# Patient Record
Sex: Male | Born: 1950 | Race: Black or African American | Hispanic: No | Marital: Married | Smoking: Former smoker
Health system: Southern US, Community
[De-identification: ages and names within clinical notes are randomized; demographics above are authoritative.]

## PROBLEM LIST (undated history)

## (undated) DIAGNOSIS — I10 Essential (primary) hypertension: Secondary | ICD-10-CM

---

## 2012-03-30 ENCOUNTER — Ambulatory Visit: Payer: Self-pay | Admitting: Emergency Medicine

## 2012-08-15 ENCOUNTER — Ambulatory Visit: Payer: Self-pay | Admitting: Family Medicine

## 2013-06-17 IMAGING — CR DG CHEST 2V
1 series · 2 of 2 positions shown · non-contrast
Comparison: none

REASON FOR EXAM: dry cough for over a month
COMMENTS:

[Series 1: pa · 0.17mm/px · 2 of 2 slices shown]
[im 1/2]
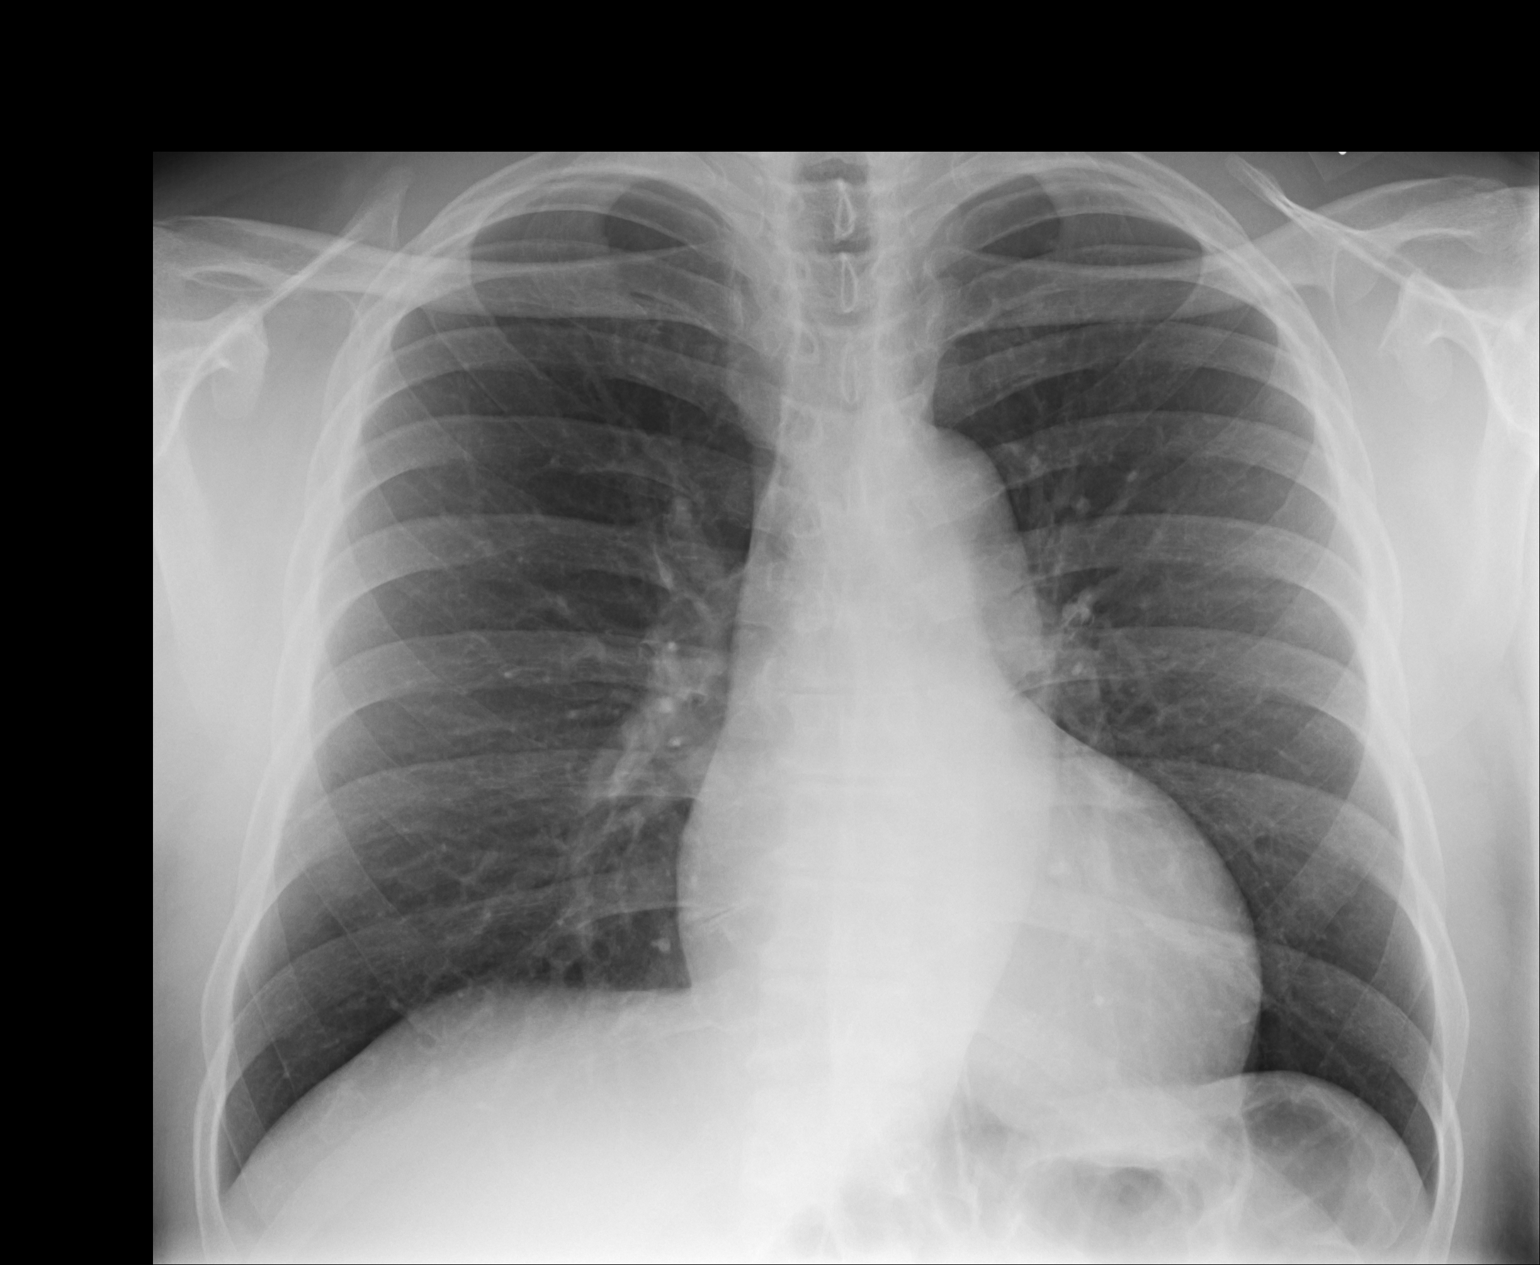
[im 2/2]
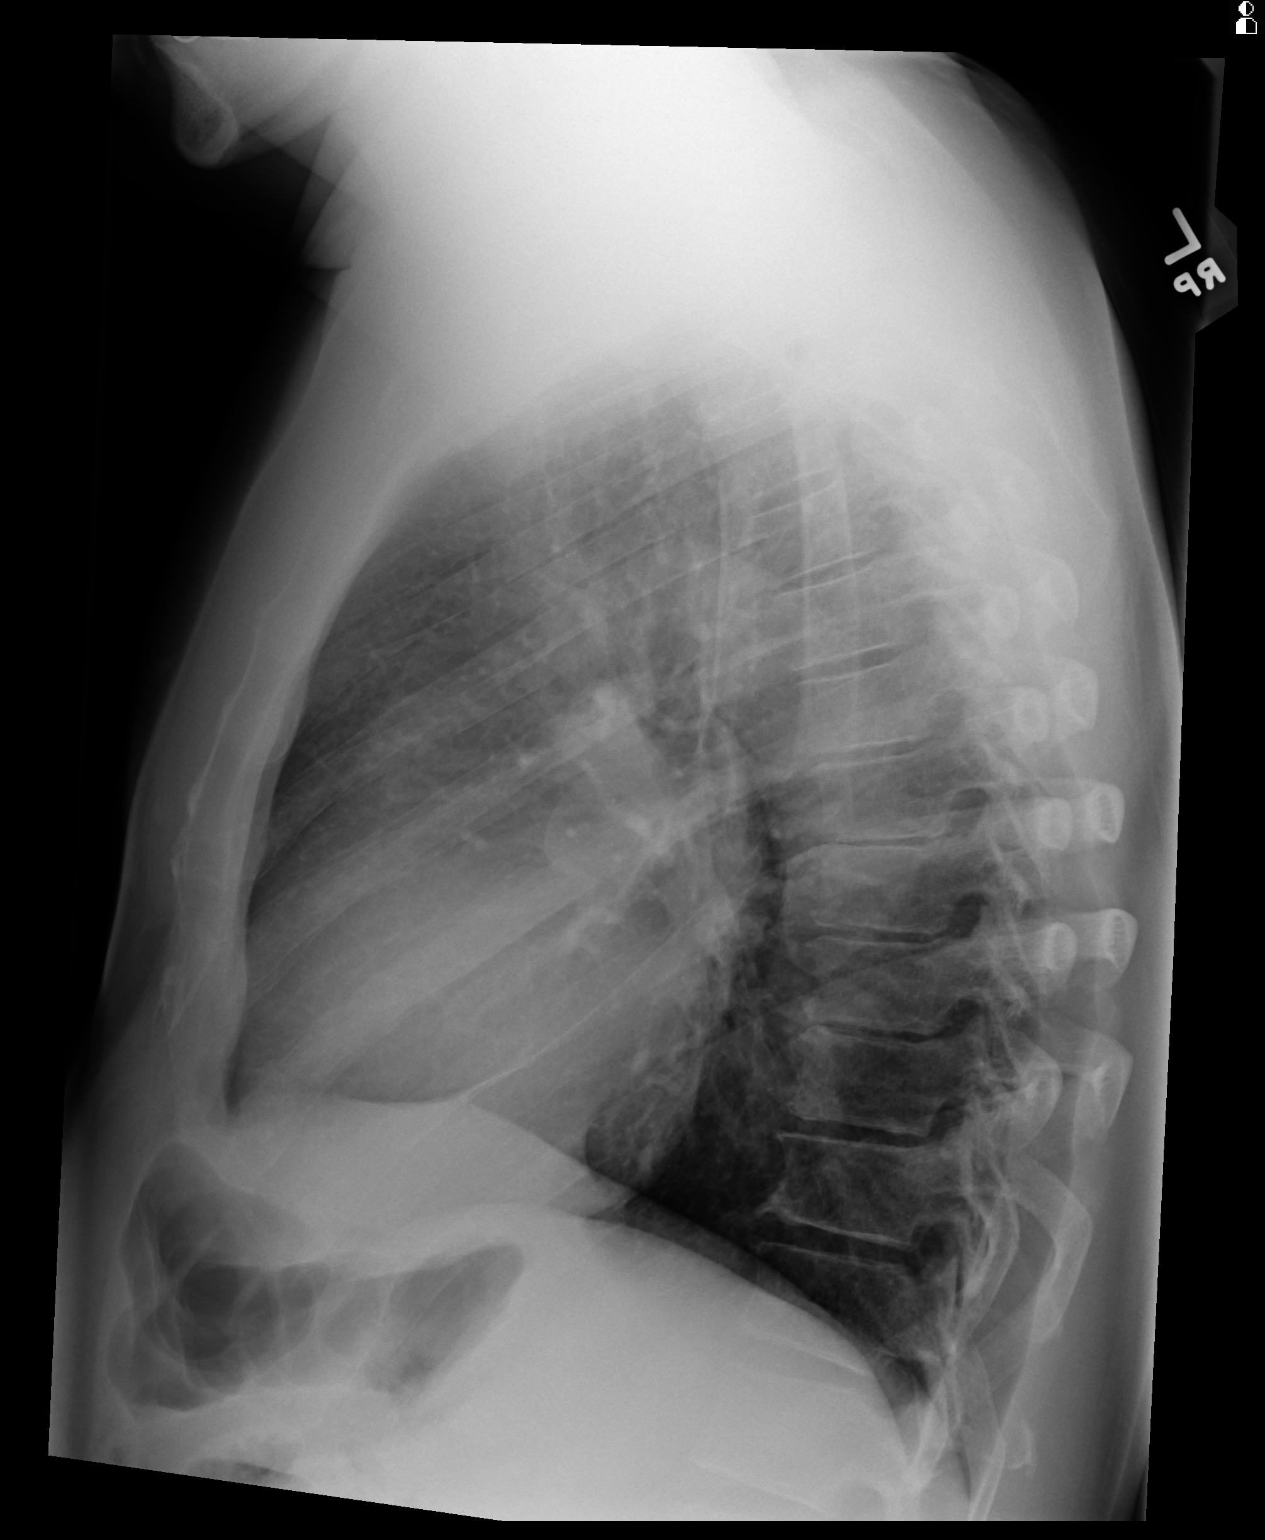

[2 of 2 positions shown; findings below may reference images not displayed]

PROCEDURE:     MDR - MDR CHEST PA(OR AP) AND LATERAL  - March 30, 2012 [DATE]

RESULT:     The lungs are well-expanded and clear. The cardiac silhouette is
normal in size. There is mild tortuosity of the descending thoracic aorta.
The pulmonary vascularity is not engorged. There is no evidence of a pleural
effusion. The mediastinum is normal in width. Mild degenerative disc space
narrowing is noted in the midthoracic spine.
IMPRESSION: I do not see evidence of pneumonia. Very mild
hyperinflation may be voluntary or could reflect underlying reactive airway
disease or COPD.

## 2015-03-30 ENCOUNTER — Ambulatory Visit: Payer: Self-pay

## 2015-03-30 ENCOUNTER — Ambulatory Visit
Admission: EM | Admit: 2015-03-30 | Discharge: 2015-03-30 | Disposition: A | Discharge status: Home / Self Care | Payer: Self-pay | Attending: Family Medicine | Admitting: Family Medicine

## 2015-03-30 ENCOUNTER — Encounter: Payer: Self-pay | Admitting: Emergency Medicine

## 2015-03-30 DIAGNOSIS — J9801 Acute bronchospasm: Secondary | ICD-10-CM | POA: Insufficient documentation

## 2015-03-30 DIAGNOSIS — R0602 Shortness of breath: Secondary | ICD-10-CM

## 2015-03-30 DIAGNOSIS — I110 Hypertensive heart disease with heart failure: Secondary | ICD-10-CM | POA: Insufficient documentation

## 2015-03-30 DIAGNOSIS — I509 Heart failure, unspecified: Secondary | ICD-10-CM | POA: Insufficient documentation

## 2015-03-30 DIAGNOSIS — N19 Unspecified kidney failure: Secondary | ICD-10-CM | POA: Insufficient documentation

## 2015-03-30 DIAGNOSIS — E876 Hypokalemia: Secondary | ICD-10-CM | POA: Insufficient documentation

## 2015-03-30 HISTORY — DX: Essential (primary) hypertension: I10

## 2015-03-30 LAB — CBC WITH DIFFERENTIAL/PLATELET
BASOS PCT: 0 %
Basophils Absolute: 0 10*3/uL (ref 0–0.1)
EOS PCT: 1 %
Eosinophils Absolute: 0 10*3/uL (ref 0–0.7)
HEMATOCRIT: 44 % (ref 40.0–52.0)
HEMOGLOBIN: 14.7 g/dL (ref 13.0–18.0)
Lymphocytes Relative: 12 %
Lymphs Abs: 0.9 10*3/uL — ABNORMAL LOW (ref 1.0–3.6)
MCH: 29.8 pg (ref 26.0–34.0)
MCHC: 33.4 g/dL (ref 32.0–36.0)
MCV: 89.1 fL (ref 80.0–100.0)
Monocytes Absolute: 0.7 10*3/uL (ref 0.2–1.0)
Monocytes Relative: 9 %
NEUTROS ABS: 6.1 10*3/uL (ref 1.4–6.5)
Neutrophils Relative %: 78 %
PLATELETS: 131 10*3/uL — AB (ref 150–440)
RBC: 4.94 MIL/uL (ref 4.40–5.90)
RDW: 17.6 % — AB (ref 11.5–14.5)
WBC: 7.7 10*3/uL (ref 3.8–10.6)

## 2015-03-30 LAB — COMPREHENSIVE METABOLIC PANEL
ALBUMIN: 3.2 g/dL — AB (ref 3.5–5.0)
ALT: 22 U/L (ref 17–63)
AST: 43 U/L — AB (ref 15–41)
Alkaline Phosphatase: 46 U/L (ref 38–126)
Anion gap: 11 (ref 5–15)
BUN: 26 mg/dL — AB (ref 6–20)
CALCIUM: 8.4 mg/dL — AB (ref 8.9–10.3)
CO2: 31 mmol/L (ref 22–32)
Chloride: 92 mmol/L — ABNORMAL LOW (ref 101–111)
Creatinine, Ser: 1.47 mg/dL — ABNORMAL HIGH (ref 0.61–1.24)
GFR calc Af Amer: 58 mL/min — ABNORMAL LOW (ref >60)
GFR calc non Af Amer: 50 mL/min — ABNORMAL LOW (ref >60)
Glucose, Bld: 103 mg/dL — ABNORMAL HIGH (ref 65–99)
Potassium: 2.9 mmol/L — CL (ref 3.5–5.1)
Sodium: 134 mmol/L — ABNORMAL LOW (ref 135–145)
Total Bilirubin: 1 mg/dL (ref 0.3–1.2)
Total Protein: 6.7 g/dL (ref 6.5–8.1)

## 2015-03-30 LAB — BRAIN NATRIURETIC PEPTIDE: B Natriuretic Peptide: 698 pg/mL — ABNORMAL HIGH (ref 0.0–100.0)

## 2015-03-30 MED ORDER — METOLAZONE 2.5 MG PO TABS: 2.5000 mg | ORAL_TABLET | Freq: Every day | ORAL | Status: AC

## 2015-03-30 MED ORDER — IPRATROPIUM-ALBUTEROL 0.5-2.5 (3) MG/3ML IN SOLN
3.0000 mL | Freq: Once | RESPIRATORY_TRACT | Status: AC
  Administered 2015-03-30: 3 mL via RESPIRATORY_TRACT

## 2015-03-30 MED ORDER — LOSARTAN POTASSIUM 25 MG PO TABS: 25.0000 mg | ORAL_TABLET | Freq: Every day | ORAL | Status: AC

## 2015-03-30 MED ORDER — CARVEDILOL 25 MG PO TABS: 25.0000 mg | ORAL_TABLET | Freq: Two times a day (BID) | ORAL | Status: AC

## 2015-03-30 MED ORDER — ISOSORBIDE DINITRATE 10 MG PO TABS: 10.0000 mg | ORAL_TABLET | Freq: Three times a day (TID) | ORAL | Status: AC

## 2015-03-30 MED ORDER — FUROSEMIDE 40 MG PO TABS: 40.0000 mg | ORAL_TABLET | Freq: Once | ORAL | Status: AC | PRN

## 2015-03-30 MED ORDER — ALBUTEROL SULFATE HFA 108 (90 BASE) MCG/ACT IN AERS: 1.0000 | INHALATION_SPRAY | Freq: Four times a day (QID) | RESPIRATORY_TRACT | Status: AC | PRN

## 2015-03-30 MED ORDER — POTASSIUM CHLORIDE CRYS ER 20 MEQ PO TBCR
40.0000 meq | EXTENDED_RELEASE_TABLET | Freq: Once | ORAL | Status: AC
  Administered 2015-03-30: 40 meq via ORAL

## 2015-03-30 MED ORDER — POTASSIUM CHLORIDE ER 10 MEQ PO TBCR: 20.0000 meq | EXTENDED_RELEASE_TABLET | Freq: Every day | ORAL | Status: AC

## 2015-03-30 NOTE — Discharge Instructions (Signed)
Because one test the BNP is not back yet please call the office between 08:00 AM  Bronchospasm A bronchospasm is when the tubes that carry air in and out of your lungs (airways) spasm or tighten. During a bronchospasm it is hard to breathe. This is because the airways get smaller. A bronchospasm can be triggered by:  Allergies. These may be to animals, pollen, food, or mold.  Infection. This is a common cause of bronchospasm.  Exercise.  Irritants. These include pollution, cigarette smoke, strong odors, aerosol sprays, and paint fumes.  Weather changes.  Stress.  Being emotional. HOME CARE   Always have a plan for getting help. Know when to call your doctor and local emergency services (911 in the [place] Know where you can get emergency care.  Only take medicines as told by your doctor.  If you were prescribed an inhaler or nebulizer machine, ask your doctor how to use it correctly. Always use a spacer with your inhaler if you were given one.  Stay calm during an attack. Try to relax and breathe more slowly.  Control your home environment:  Change your heating and air conditioning filter at least once a month.  Limit your use of fireplaces and wood stoves.  Do not  smoke. Do not  allow smoking in your home.  Avoid perfumes and fragrances.  Get rid of pests (such as roaches and mice) and their droppings.  Throw away plants if you see mold on them.  Keep your house clean and dust free.  Replace carpet with wood, tile, or vinyl flooring. Carpet can trap dander and dust.  Use allergy-proof pillows, mattress covers, and box spring covers.  Wash bed sheets and blankets every week in hot water. Dry them in a dryer.  Use blankets that are made of polyester or cotton.  Wash hands frequently. GET HELP IF:  You have muscle aches.  You have chest pain.  The thick spit you spit or cough up (sputum) changes from clear or white to yellow, green, gray, or bloody.  The  thick spit you spit or cough up gets thicker.  There are problems that may be related to the medicine you are given such as:  A rash.  Itching.  Swelling.  Trouble breathing. GET HELP RIGHT AWAY IF:  You feel you cannot breathe or catch your breath.  You cannot stop coughing.  Your treatment is not helping you breathe better.  You have very bad chest pain. MAKE SURE YOU:   Understand these instructions.  Will watch your condition.  Will get help right away if you are not doing well or get worse. Document Released: 08/21/2009 Document Revised: 10/29/2013 Document Reviewed: 04/16/2013 [facility] Patient Information 2015 [employer], [place]. This information is not intended to replace advice given to you by your health care provider. Make sure you discuss any questions you have with your health care provider.  Heart Failure Heart failure is a condition in which the heart has trouble pumping blood. This means your heart does not pump blood efficiently for your body to work well. In some cases of heart failure, fluid may back up into your lungs or you may have swelling (edema) in your lower legs. Heart failure is usually a long-term (chronic) condition. It is important for you to take good care of yourself and follow your health care provider's treatment plan. CAUSES  Some health conditions can cause heart failure. Those health conditions include:  High blood pressure (hypertension). Hypertension causes the heart  muscle to work harder than normal. When pressure in the blood vessels is high, the heart needs to pump (contract) with more force in order to circulate blood throughout the body. High blood pressure eventually causes the heart to become stiff and weak.  Coronary artery disease (CAD). CAD is the buildup of cholesterol and fat (plaque) in the arteries of the heart. The blockage in the arteries deprives the heart muscle of oxygen and blood. This can cause chest pain and may lead to a  heart attack. High blood pressure can also contribute to CAD.  Heart attack (myocardial infarction). A heart attack occurs when one or more arteries in the heart become blocked. The loss of oxygen damages the muscle tissue of the heart. When this happens, part of the heart muscle dies. The injured tissue does not contract as well and weakens the heart's ability to pump blood.  Abnormal heart valves. When the heart valves do not open and close properly, it can cause heart failure. This makes the heart muscle pump harder to keep the blood flowing.  Heart muscle disease (cardiomyopathy or myocarditis). Heart muscle disease is damage to the heart muscle from a variety of causes. These can include drug or alcohol abuse, infections, or unknown reasons. These can increase the risk of heart failure.  Lung disease. Lung disease makes the heart work harder because the lungs do not work properly. This can cause a strain on the heart, leading it to fail.  Diabetes. Diabetes increases the risk of heart failure. High blood sugar contributes to high fat (lipid) levels in the blood. Diabetes can also cause slow damage to tiny blood vessels that carry important nutrients to the heart muscle. When the heart does not get enough oxygen and food, it can cause the heart to become weak and stiff. This leads to a heart that does not contract efficiently.  Other conditions can contribute to heart failure. These include abnormal heart rhythms, thyroid problems, and low blood counts (anemia). Certain unhealthy behaviors can increase the risk of heart failure, including:  Being overweight.  Smoking or chewing tobacco.  Eating foods high in fat and cholesterol.  Abusing illicit drugs or alcohol.  Lacking physical activity. SYMPTOMS  Heart failure symptoms may vary and can be hard to detect. Symptoms may include:  Shortness of breath with activity, such as climbing stairs.  Persistent cough.  Swelling of the feet,  ankles, legs, or abdomen.  Unexplained weight gain.  Difficulty breathing when lying flat (orthopnea).  Waking from sleep because of the need to sit up and get more air.  Rapid heartbeat.  Fatigue and loss of energy.  Feeling light-headed, dizzy, or close to fainting.  Loss of appetite.  Nausea.  Increased urination during the night (nocturia). DIAGNOSIS  A diagnosis of heart failure is based on your history, symptoms, physical examination, and diagnostic tests. Diagnostic tests for heart failure may include:  Echocardiography.  Electrocardiography.  Chest X-ray.  Blood tests.  Exercise stress test.  Cardiac angiography.  Radionuclide scans. TREATMENT  Treatment is aimed at managing the symptoms of heart failure. Medicines, behavioral changes, or surgical intervention may be necessary to treat heart failure.  Medicines to help treat heart failure may include:  Angiotensin-converting enzyme (ACE) inhibitors. This type of medicine blocks the effects of a blood protein called angiotensin-converting enzyme. ACE inhibitors relax (dilate) the blood vessels and help lower blood pressure.  Angiotensin receptor blockers (ARBs). This type of medicine blocks the actions of a blood protein called  angiotensin. Angiotensin receptor blockers dilate the blood vessels and help lower blood pressure.  Water pills (diuretics). Diuretics cause the kidneys to remove salt and water from the blood. The extra fluid is removed through urination. This loss of extra fluid lowers the volume of blood the heart pumps.  Beta blockers. These prevent the heart from beating too fast and improve heart muscle strength.  Digitalis. This increases the force of the heartbeat.  Healthy behavior changes include:  Obtaining and maintaining a healthy weight.  Stopping smoking or chewing tobacco.  Eating heart-healthy foods.  Limiting or avoiding alcohol.  Stopping illicit drug use.  Physical  activity as directed by your health care provider.  Surgical treatment for heart failure may include:  A procedure to open blocked arteries, repair damaged heart valves, or remove damaged heart muscle tissue.  A pacemaker to improve heart muscle function and control certain abnormal heart rhythms.  An internal cardioverter defibrillator to treat certain serious abnormal heart rhythms.  A left ventricular assist device (LVAD) to assist the pumping ability of the heart. HOME CARE INSTRUCTIONS   Take medicines only as directed by your health care provider. Medicines are important in reducing the workload of your heart, slowing the progression of heart failure, and improving your symptoms.  Do not stop taking your medicine unless directed by your health care provider.  Do not skip any dose of medicine.  Refill your prescriptions before you run out of medicine. Your medicines are needed every day.  Engage in moderate physical activity if directed by your health care provider. Moderate physical activity can benefit some people. The elderly and people with severe heart failure should consult with a health care provider for physical activity recommendations.  Eat heart-healthy foods. Food choices should be free of trans fat and low in saturated fat, cholesterol, and salt (sodium). Healthy choices include fresh or frozen fruits and vegetables, fish, lean meats, legumes, fat-free or low-fat dairy products, and whole grain or high fiber foods. Talk to a dietitian to learn more about heart-healthy foods.  Limit sodium if directed by your health care provider. Sodium restriction may reduce symptoms of heart failure in some people. Talk to a dietitian to learn more about heart-healthy seasonings.  Use healthy cooking methods. Healthy cooking methods include roasting, grilling, broiling, baking, poaching, steaming, or stir-frying. Talk to a dietitian to learn more about healthy cooking methods.  Limit  fluids if directed by your health care provider. Fluid restriction may reduce symptoms of heart failure in some people.  Weigh yourself every day. Daily weights are important in the early recognition of excess fluid. You should weigh yourself every morning after you urinate and before you eat breakfast. Wear the same amount of clothing each time you weigh yourself. Record your daily weight. Provide your health care provider with your weight record.  Monitor and record your blood pressure if directed by your health care provider.  Check your pulse if directed by your health care provider.  Lose weight if directed by your health care provider. Weight loss may reduce symptoms of heart failure in some people.  Stop smoking or chewing tobacco. Nicotine makes your heart work harder by causing your blood vessels to constrict. Do not use nicotine gum or patches before talking to your health care provider.  Keep all follow-up visits as directed by your health care provider. This is important.  Limit alcohol intake to no more than 1 drink per day for nonpregnant women and 2 drinks per day  for men. One drink equals 12 ounces of beer, 5 ounces of wine, or 1 ounces of hard liquor. Drinking more than that is harmful to your heart. Tell your health care provider if you drink alcohol several times a week. Talk with your health care provider about whether alcohol is safe for you. If your heart has already been damaged by alcohol or you have severe heart failure, drinking alcohol should be stopped completely.  Stop illicit drug use.  Stay up-to-date with immunizations. It is especially important to prevent respiratory infections through current pneumococcal and influenza immunizations.  Manage other health conditions such as hypertension, diabetes, thyroid disease, or abnormal heart rhythms as directed by your health care provider.  Learn to manage stress.  Plan rest periods when fatigued.  Learn strategies  to manage high temperatures. If the weather is extremely hot:  Avoid vigorous physical activity.  Use air conditioning or fans or seek a cooler location.  Avoid caffeine and alcohol.  Wear loose-fitting, lightweight, and light-colored clothing.  Learn strategies to manage cold temperatures. If the weather is extremely cold:  Avoid vigorous physical activity.  Layer clothes.  Wear mittens or gloves, a hat, and a scarf when going outside.  Avoid alcohol.  Obtain ongoing education and support as needed.  Participate in or seek rehabilitation as needed to maintain or improve independence and quality of life. SEEK MEDICAL CARE IF:   Your weight increases by 03 lb/1.4 kg in 1 day or 05 lb/2.3 kg in a week.  You have increasing shortness of breath that is unusual for you.  You are unable to participate in your usual physical activities.  You tire easily.  You cough more than normal, especially with physical activity.  You have any or more swelling in areas such as your hands, feet, ankles, or abdomen.  You are unable to sleep because it is hard to breathe.  You feel like your heart is beating fast (palpitations).  You become dizzy or light-headed upon standing up. SEEK IMMEDIATE MEDICAL CARE IF:   You have difficulty breathing.  There is a change in mental status such as decreased alertness or difficulty with concentration.  You have a pain or discomfort in your chest.  You have an episode of fainting (syncope). MAKE SURE YOU:   Understand these instructions.  Will watch your condition.  Will get help right away if you are not doing well or get worse. Document Released: 10/24/2005 Document Revised: 03/10/2014 Document Reviewed: 11/23/2012 [facility] Patient Information 2015 [place], [place]. This information is not intended to replace advice given to you by your health care provider. Make sure you discuss any questions you have with your health care  provider.  Hypokalemia Hypokalemia means that the amount of potassium in the blood is lower than normal.Potassium is a chemical, called an electrolyte, that helps regulate the amount of fluid in the body. It also stimulates muscle contraction and helps nerves function properly.Most of the body's potassium is inside of cells, and only a very small amount is in the blood. Because the amount in the blood is so small, minor changes can be life-threatening. CAUSES  Antibiotics.  Diarrhea or vomiting.  Using laxatives too much, which can cause diarrhea.  Chronic kidney disease.  Water pills (diuretics).  Eating disorders (bulimia).  Low magnesium level.  Sweating a lot. SIGNS AND SYMPTOMS  Weakness.  Constipation.  Fatigue.  Muscle cramps.  Mental confusion.  Skipped heartbeats or irregular heartbeat (palpitations).  Tingling or numbness. DIAGNOSIS  Your health care provider can diagnose hypokalemia with blood tests. In addition to checking your potassium level, your health care provider may also check other lab tests. TREATMENT Hypokalemia can be treated with potassium supplements taken by mouth or adjustments in your current medicines. If your potassium level is very low, you may need to get potassium through a vein (IV) and be monitored in the hospital. A diet high in potassium is also helpful. Foods high in potassium are:  Nuts, such as peanuts and pistachios.  Seeds, such as sunflower seeds and pumpkin seeds.  Peas, lentils, and lima beans.  Whole grain and bran cereals and breads.  Fresh fruit and vegetables, such as apricots, avocado, bananas, cantaloupe, kiwi, oranges, tomatoes, asparagus, and potatoes.  Orange and tomato juices.  Red meats.  Fruit yogurt. HOME CARE INSTRUCTIONS  Take all medicines as prescribed by your health care provider.  Maintain a healthy diet by including nutritious food, such as fruits, vegetables, nuts, whole grains, and lean  meats.  If you are taking a laxative, be sure to follow the directions on the label. SEEK MEDICAL CARE IF:  Your weakness gets worse.  You feel your heart pounding or racing.  You are vomiting or having diarrhea.  You are diabetic and having trouble keeping your blood glucose in the normal range. SEEK IMMEDIATE MEDICAL CARE IF:  You have chest pain, shortness of breath, or dizziness.  You are vomiting or having diarrhea for more than 2 days.  You faint. MAKE SURE YOU:   Understand these instructions.  Will watch your condition.  Will get help right away if you are not doing well or get worse. Document Released: 10/24/2005 Document Revised: 08/14/2013 Document Reviewed: 04/26/2013 [facility] Patient Information 2015 [place], [place]. This information is not intended to replace advice given to you by your health care provider. Make sure you discuss any questions you have with your health care provider.  Kidney Failure Kidney failure happens when the kidneys cannot remove waste and excess fluid that naturally builds up in your blood after your body breaks down food. This leads to a dangerous buildup of waste products and fluid in the blood. HOME CARE  Follow your diet as told by your doctor.  Take all medicines as told by your doctor.  Keep all of your dialysis appointments. Call if you are unable to keep an appointment. GET HELP RIGHT AWAY IF:   You make a lot more or very little pee (urine).  Your face or ankles puff up (swell).  You develop shortness of breath.  You develop weakness, feel tired, or you do not feel hungry (appetite loss).  You feel poorly for no known reason. MAKE SURE YOU:   Understand these instructions.  Will watch your condition.  Will get help right away if you are not doing well or get worse. Document Released: 01/18/2010 Document Revised: 01/16/2012 Document Reviewed: 02/24/2010 [facility] Patient Information 2015 [place], [place]. This  information is not intended to replace advice given to you by your health care provider. Make sure you discuss any questions you have with your health care provider.  Potassium Content of Foods Potassium is a mineral found in many foods and drinks. It helps keep fluids and minerals balanced in your body and affects how steadily your heart beats. Potassium also helps control your blood pressure and keep your muscles and nervous system healthy. Certain health conditions and medicines may change the balance of potassium in your body. When this happens, you can help  balance your level of potassium through the foods that you do or do not eat. Your health care provider or dietitian may recommend an amount of potassium that you should have each day. The following lists of foods provide the amount of potassium (in parentheses) per serving in each item. HIGH IN POTASSIUM  The following foods and beverages have 200 mg or more of potassium per serving:  Apricots, 2 raw or 5 dry (200 mg).  Artichoke, 1 medium (345 mg).  Avocado, raw,  each (245 mg).  Banana, 1 medium (425 mg).  Beans, lima, or baked beans, canned,  cup (280 mg).  Beans, white, canned,  cup (595 mg).  Beef roast, 3 oz (320 mg).  Beef, ground, 3 oz (270 mg).  Beets, raw or cooked,  cup (260 mg).  Bran muffin, 2 oz (300 mg).  Broccoli,  cup (230 mg).  Brussels sprouts,  cup (250 mg).  Cantaloupe,  cup (215 mg).  Cereal, 100% bran,  cup (200-400 mg).  Cheeseburger, single, fast food, 1 each (225-400 mg).  Chicken, 3 oz (220 mg).  Clams, canned, 3 oz (535 mg).  Crab, 3 oz (225 mg).  Dates, 5 each (270 mg).  Dried beans and peas,  cup (300-475 mg).  Figs, dried, 2 each (260 mg).  Fish: halibut, tuna, cod, snapper, 3 oz (480 mg).  Fish: salmon, haddock, swordfish, perch, 3 oz (300 mg).  Fish, tuna, canned 3 oz (200 mg).  [place] fries, fast food, 3 oz (470 mg).  Granola with fruit and nuts,  cup (200  mg).  Grapefruit juice,  cup (200 mg).  Greens, beet,  cup (655 mg).  Honeydew melon,  cup (200 mg).  Kale, raw, 1 cup (300 mg).  Kiwi, 1 medium (240 mg).  Kohlrabi, rutabaga, parsnips,  cup (280 mg).  Lentils,  cup (365 mg).  Mango, 1 each (325 mg).  Milk, chocolate, 1 cup (420 mg).  Milk: nonfat, low-fat, whole, buttermilk, 1 cup (350-380 mg).  Molasses, 1 Tbsp (295 mg).  Mushrooms,  cup (280) mg.  Nectarine, 1 each (275 mg).  Nuts: almonds, peanuts, hazelnuts, [place], cashew, mixed, 1 oz (200 mg).  Nuts, pistachios, 1 oz (295 mg).  Orange, 1 each (240 mg).  Orange juice,  cup (235 mg).  Papaya, medium,  fruit (390 mg).  Peanut butter, chunky, 2 Tbsp (240 mg).  Peanut butter, smooth, 2 Tbsp (210 mg).  Pear, 1 medium (200 mg).  Pomegranate, 1 whole (400 mg).  Pomegranate juice,  cup (215 mg).  Pork, 3 oz (350 mg).  Potato chips, salted, 1 oz (465 mg).  Potato, baked with skin, 1 medium (925 mg).  Potatoes, boiled,  cup (255 mg).  Potatoes, mashed,  cup (330 mg).  Prune juice,  cup (370 mg).  Prunes, 5 each (305 mg).  Pudding, chocolate,  cup (230 mg).  Pumpkin, canned,  cup (250 mg).  Raisins, seedless,  cup (270 mg).  Seeds, sunflower or pumpkin, 1 oz (240 mg).  Soy milk, 1 cup (300 mg).  Spinach,  cup (420 mg).  Spinach, canned,  cup (370 mg).  Sweet potato, baked with skin, 1 medium (450 mg).  Swiss chard,  cup (480 mg).  Tomato or vegetable juice,  cup (275 mg).  Tomato sauce or puree,  cup (400-550 mg).  Tomato, raw, 1 medium (290 mg).  Tomatoes, canned,  cup (200-300 mg).  [place], 3 oz (250 mg).  Wheat germ, 1 oz (250 mg).  Winter squash,  cup (  250 mg).  Yogurt, plain or fruited, 6 oz (260-435 mg).  Zucchini,  cup (220 mg). MODERATE IN POTASSIUM The following foods and beverages have 50-200 mg of potassium per serving:  Apple, 1 each (150 mg).  Apple juice,  cup (150  mg).  Applesauce,  cup (90 mg).  Apricot nectar,  cup (140 mg).  Asparagus, small spears,  cup or 6 spears (155 mg).  Bagel, cinnamon raisin, 1 each (130 mg).  Bagel, egg or plain, 4 in., 1 each (70 mg).  Beans, green,  cup (90 mg).  Beans, yellow,  cup (190 mg).  Beer, regular, 12 oz (100 mg).  Beets, canned,  cup (125 mg).  Blackberries,  cup (115 mg).  Blueberries,  cup (60 mg).  Bread, whole wheat, 1 slice (70 mg).  Broccoli, raw,  cup (145 mg).  Cabbage,  cup (150 mg).  Carrots, cooked or raw,  cup (180 mg).  Cauliflower, raw,  cup (150 mg).  Celery, raw,  cup (155 mg).  Cereal, bran flakes, cup (120-150 mg).  Cheese, cottage,  cup (110 mg).  Cherries, 10 each (150 mg).  Chocolate, 1 oz bar (165 mg).  Coffee, brewed 6 oz (90 mg).  Corn,  cup or 1 ear (195 mg).  Cucumbers,  cup (80 mg).  Egg, large, 1 each (60 mg).  Eggplant,  cup (60 mg).  Endive, raw, cup (80 mg).  English muffin, 1 each (65 mg).  Fish, orange roughy, 3 oz (150 mg).  Frankfurter, beef or pork, 1 each (75 mg).  Fruit cocktail,  cup (115 mg).  Grape juice,  cup (170 mg).  Grapefruit,  fruit (175 mg).  Grapes,  cup (155 mg).  Greens: kale, turnip, collard,  cup (110-150 mg).  Ice cream or frozen yogurt, chocolate,  cup (175 mg).  Ice cream or frozen yogurt, vanilla,  cup (120-150 mg).  Lemons, limes, 1 each (80 mg).  Lettuce, all types, 1 cup (100 mg).  Mixed vegetables,  cup (150 mg).  Mushrooms, raw,  cup (110 mg).  Nuts: walnuts, pecans, or macadamia, 1 oz (125 mg).  Oatmeal,  cup (80 mg).  Okra,  cup (110 mg).  Onions, raw,  cup (120 mg).  Peach, 1 each (185 mg).  Peaches, canned,  cup (120 mg).  Pears, canned,  cup (120 mg).  Peas, green, frozen,  cup (90 mg).  Peppers, green,  cup (130 mg).  Peppers, red,  cup (160 mg).  Pineapple juice,  cup (165 mg).  Pineapple, fresh or canned,  cup (100  mg).  Plums, 1 each (105 mg).  Pudding, vanilla,  cup (150 mg).  Raspberries,  cup (90 mg).  Rhubarb,  cup (115 mg).  Rice, wild,  cup (80 mg).  Shrimp, 3 oz (155 mg).  Spinach, raw, 1 cup (170 mg).  Strawberries,  cup (125 mg).  Summer squash  cup (175-200 mg).  Swiss chard, raw, 1 cup (135 mg).  Tangerines, 1 each (140 mg).  Tea, brewed, 6 oz (65 mg).  Turnips,  cup (140 mg).  Watermelon,  cup (85 mg).  Wine, red, table, 5 oz (180 mg).  Wine, white, table, 5 oz (100 mg). LOW IN POTASSIUM The following foods and beverages have less than 50 mg of potassium per serving.  Bread, white, 1 slice (30 mg).  Carbonated beverages, 12 oz (less than 5 mg).  Cheese, 1 oz (20-30 mg).  Cranberries,  cup (45 mg).  Cranberry juice cocktail,  cup (20 mg).  Fats and oils, 1 Tbsp (less than 5 mg).  Hummus, 1 Tbsp (32 mg).  Nectar: papaya, mango, or pear,  cup (35 mg).  Rice, white or brown,  cup (50 mg).  Spaghetti or macaroni,  cup cooked (30 mg).  Tortilla, flour or corn, 1 each (50 mg).  Waffle, 4 in., 1 each (50 mg).  Water chestnuts,  cup (40 mg). Document Released: 06/07/2005 Document Revised: 10/29/2013 Document Reviewed: 09/20/2013 [facility] Patient Information 2015 [place], [place]. This information is not intended to replace advice given to you by your health care provider. Make sure you discuss any questions you have with your health care provider. and 02:00 tomorrow afternoon to see if dose of lasix will need to be changed differently.

## 2015-03-30 NOTE — ED Notes (Signed)
Patient c/o SOB, tiredness, and cough for over a week.  Patient denies fevers.  Patient reports chills at night.

## 2015-03-30 NOTE — ED Provider Notes (Signed)
CSN: [id number]     Arrival date & time 03/30/15  [id number] History   First MD Initiated Contact with Patient 03/30/15 1032     Chief Complaint  Patient presents with  . Shortness of Breath  . Cough   (Consider location/radiation/quality/duration/timing/severity/associated sxs/prior Treatment) Patient is a 64 y.o. male presenting with shortness of breath and cough. The history is provided by the patient. No language interpreter was used.  Shortness of Breath Severity:  Moderate Onset quality:  Gradual Duration:  1 week Timing:  Constant Progression:  Worsening Chronicity:  Recurrent Context: activity   Relieved by:  Rest Worsened by:  Exertion Ineffective treatments:  Diuretics Associated symptoms: cough, diaphoresis and wheezing   Associated symptoms: no chest pain, no sore throat, no sputum production and no swollen glands   Cough:    Cough characteristics:  Non-productive   Sputum characteristics:  Clear   Severity:  Moderate   Onset quality:  Sudden   Timing:  Constant   Progression:  Worsening   Chronicity:  New Risk factors: no recent alcohol use, no family hx of DVT, no hx of cancer, no hx of PE/DVT, no obesity, no oral contraceptive use, no prolonged immobilization and no recent surgery   Risk factors comment:  Patient has a history is CHF. Cough Associated symptoms: diaphoresis, shortness of breath and wheezing   Associated symptoms: no chest pain and no sore throat    Turns out that the patient has had a history of congestive heart failure. He thought that this was due to leakage in his heart but there is some history of being discussed with him some type of implant to help him. [facility] [place] He has not seen the cardiologist since September last year before he lost his insurance. He admits that he has not been taking his medicines on a daily basis until recently. Someone told him he only has to take the medication when he needed it.  Should be noted the last prescription should've ran out he was taking it like he should have. Past Medical History  Diagnosis Date  . Hypertension    History reviewed. No pertinent past surgical history. History reviewed. No pertinent family history. History  Substance Use Topics  . Smoking status: Former Games developermoker  . Smokeless tobacco: Never Used  . Alcohol Use: Yes    Review of Systems  Constitutional: Positive for diaphoresis.  HENT: Negative for sore throat.   Respiratory: Positive for cough, shortness of breath and wheezing. Negative for apnea, sputum production, chest tightness and stridor.   Cardiovascular: Negative for chest pain and palpitations.    Allergies  Review of patient's allergies indicates no known allergies.  Home Medications   Prior to Admission medications   Medication Sig Start Date End Date Taking? Authorizing Provider  Multiple Vitamin (MULTIVITAMIN) tablet Take 1 tablet by mouth daily.   Yes Historical Provider, MD  albuterol (PROVENTIL HFA;VENTOLIN HFA) 108 (90 BASE) MCG/ACT inhaler Inhale 1-2 puffs into the lungs every 6 (six) hours as needed for wheezing or shortness of breath. 03/30/15   Hassan RowanEugene Dardan Shelton, MD  carvedilol (COREG) 25 MG tablet Take 1 tablet (25 mg total) by mouth 2 (two) times daily with a meal. 03/30/15   Hassan RowanEugene Amardeep Beckers, MD  furosemide (LASIX) 40 MG tablet Take 1 tablet (40 mg total) by mouth once as needed. may take a second dose as needed 03/30/15   Hassan RowanEugene Olive Zmuda, MD  isosorbide dinitrate (ISORDIL) 10 MG tablet Take  1 tablet (10 mg total) by mouth 3 (three) times daily. 03/30/15   Hassan Rowan, MD  losartan (COZAAR) 25 MG tablet Take 1 tablet (25 mg total) by mouth daily. 03/30/15   Hassan Rowan, MD  metolazone (ZAROXOLYN) 2.5 MG tablet Take 1 tablet (2.5 mg total) by mouth daily. 03/30/15   Hassan Rowan, MD  potassium chloride (K-DUR) 10 MEQ tablet Take 2 tablets (20 mEq total) by mouth daily. Take twice a day for 5 days and then once a day until K is  rechecked in 1-2 weeks. 03/30/15   Hassan Rowan, MD   BP 117/87 mmHg  Pulse 87  Temp(Src) 99.2 F (37.3 C) (Tympanic)  Resp 17  Ht  (1.753 m)  Wt 180 lb (81.647 kg)  BMI 26.57 kg/m2  SpO2 100% Physical Exam  Constitutional: He appears well-developed and well-nourished.  HENT:  Head: Normocephalic and atraumatic.  Eyes: Pupils are equal, round, and reactive to light.  Neck: Neck supple. No tracheal deviation present.  Cardiovascular: Normal rate, S1 normal, S2 normal and normal heart sounds.  An irregular rhythm present.    No systolic murmur is present   No diastolic murmur is present  Pulmonary/Chest: No accessory muscle usage or stridor. No respiratory distress. He has wheezes. Chest wall is not dull to percussion. He exhibits no tenderness.  Musculoskeletal: Normal range of motion. He exhibits no edema or tenderness.  Lymphadenopathy:    He has no cervical adenopathy.  Neurological: He is alert.  Skin: Skin is warm and dry.  Psychiatric: He has a normal mood and affect.   patient heart beat was irregular but EKG showed a normal sinus rhythm with some left atrial enlargement and left axis deviation and nonspecific T-wave abnormality. But the A. fib and I was concerned about initially was not confirmed with an EKG. We'll proceed further studies and test.  ED Course  Procedures (including critical care time) Labs Review Labs Reviewed  CBC WITH DIFFERENTIAL/PLATELET - Abnormal; Notable for the following:    RDW 17.6 (*)    Platelets 131 (*)    Lymphs Abs 0.9 (*)    All other components within normal limits  COMPREHENSIVE METABOLIC PANEL - Abnormal; Notable for the following:    Sodium 134 (*)    Potassium 2.9 (*)    Chloride 92 (*)    Glucose, Bld 103 (*)    BUN 26 (*)    Creatinine, Ser 1.47 (*)    Calcium 8.4 (*)    Albumin 3.2 (*)    AST 43 (*)    GFR calc non Af Amer 50 (*)    GFR calc Af Amer 58 (*)    All other components within normal limits  BRAIN  NATRIURETIC PEPTIDE - Abnormal; Notable for the following:    B Natriuretic Peptide 698.0 (*)    All other components within normal limits    Imaging Review Dg Chest 2 View  03/30/2015   CLINICAL DATA:  Shortness of breath and fatigue. Fever for 1 week with night sweats.  EXAM: CHEST  2 VIEW  COMPARISON:  03/30/2012  FINDINGS: The heart size is mildly enlarged and this represents a change from 2013. There is no evidence for airspace disease or pulmonary edema. The trachea is midline. No evidence for pleural effusions. Degenerative disc and endplate changes in the mid thoracic spine.  IMPRESSION: Concern for mild cardiomegaly.  No acute lung findings.   Electronically Signed   By: Meriel Pica.D.  On: 03/30/2015 11:54   Patient reports marked improvement after Duoneb aerosol treatment. Discussed w/him the results of the tests. Will give him inhaler, renew medications , work note for today and tomorrow. Stress the importance of getting a follow up w/cardiologist. I will have patient call tomorrow about nmajor changes in his lasix dosage after we get his BNP results back. Will add K for the short term and give him a month scrip of his other medications.    Patient BNP was elevated but under thousand. No new changes will be made his Lasix but continue current medication treatment.     MDM   1. SOB (shortness of breath) on exertion   2. Hypertensive cardiomegaly with heart failure   3. Acute exacerbation of CHF (congestive heart failure)   4. Acute hypokalemia   5. Bronchospasm, acute   6. Renal failure        Hassan Rowan, MD 03/31/15 [id number]

## 2015-03-31 ENCOUNTER — Telehealth: Payer: Self-pay | Admitting: Emergency Medicine

## 2015-03-31 NOTE — ED Notes (Signed)
Patient's results was reviewed by Dr. Thurmond ButtsWade.  Patient was notified of his BNP results and was instructed per Dr. Thurmond ButtsWade to continue with his Lasix as he has been taken it and to follow-up with his primary physician as soon as possible.  Patient verbalized understanding.

## 2016-06-16 IMAGING — CR DG CHEST 2V
2 series · 2 of 2 positions shown · non-contrast
Comparison: 03/30/2012

CLINICAL DATA: Shortness of breath and fatigue. Fever for 1 week
with night sweats.

EXAM:
CHEST  2 VIEW

[chest pa]
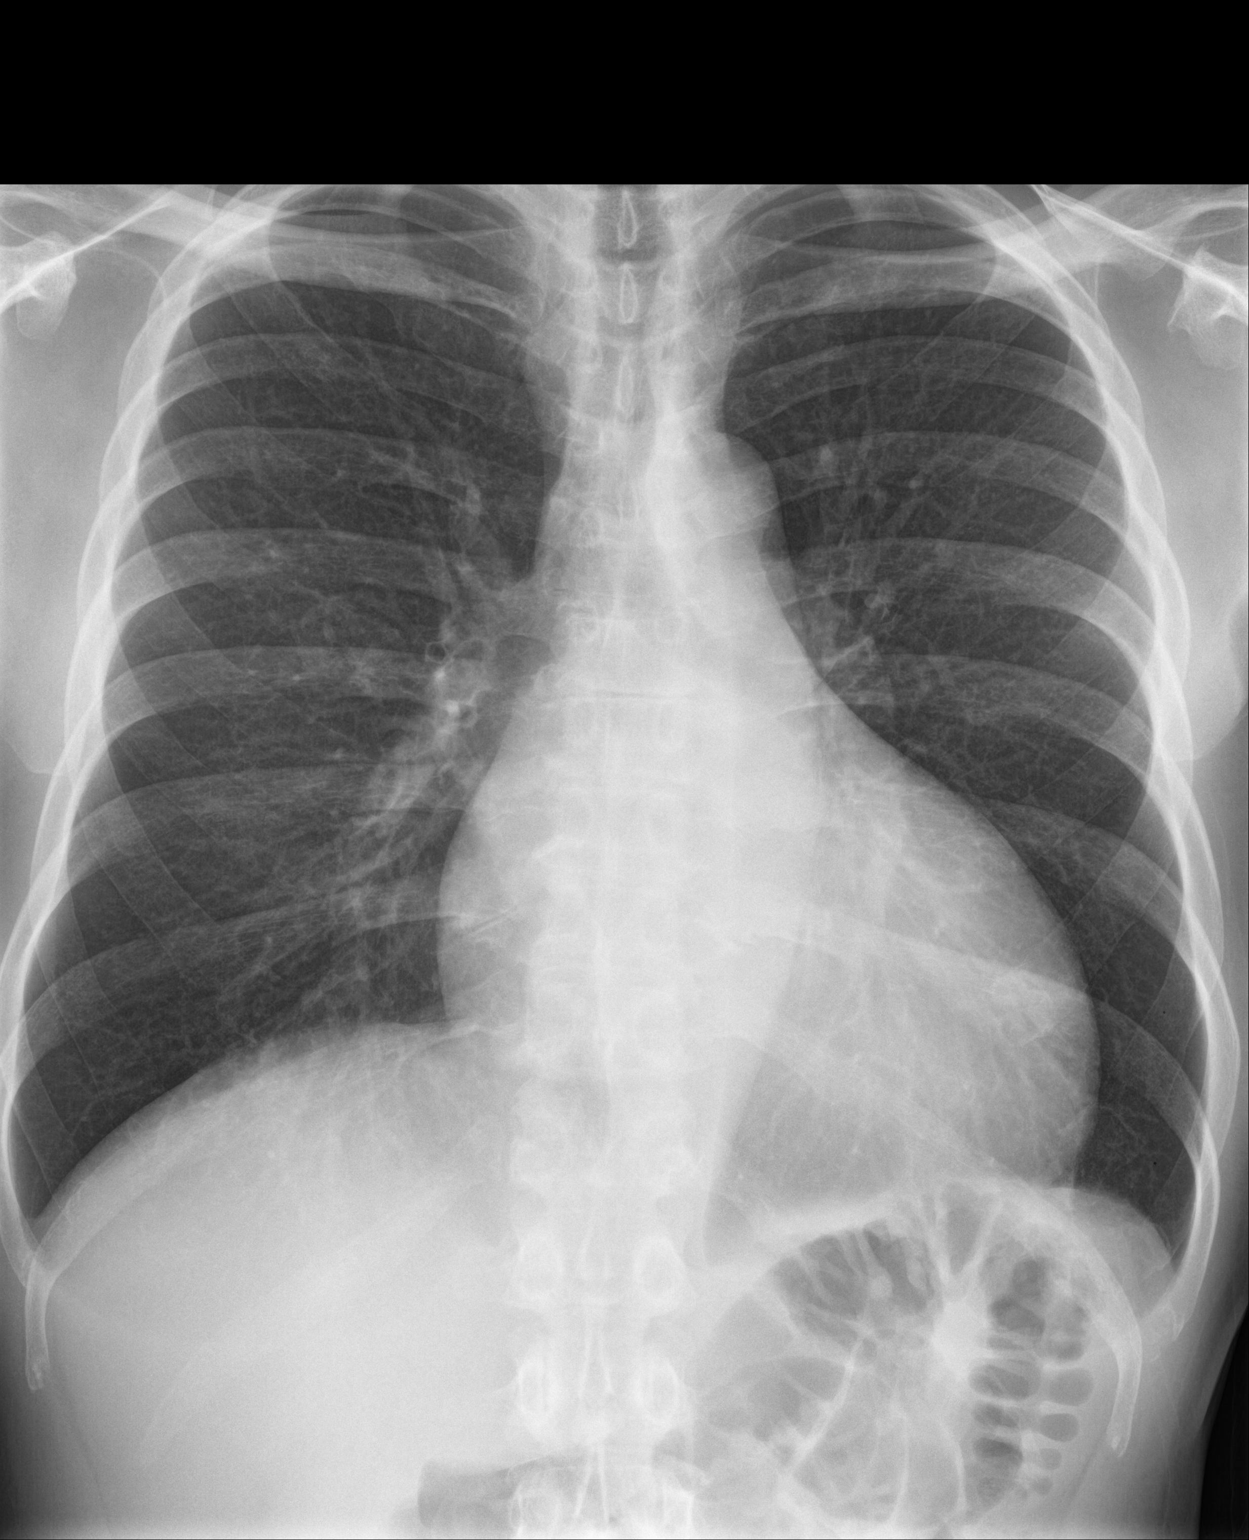

[chest lat]
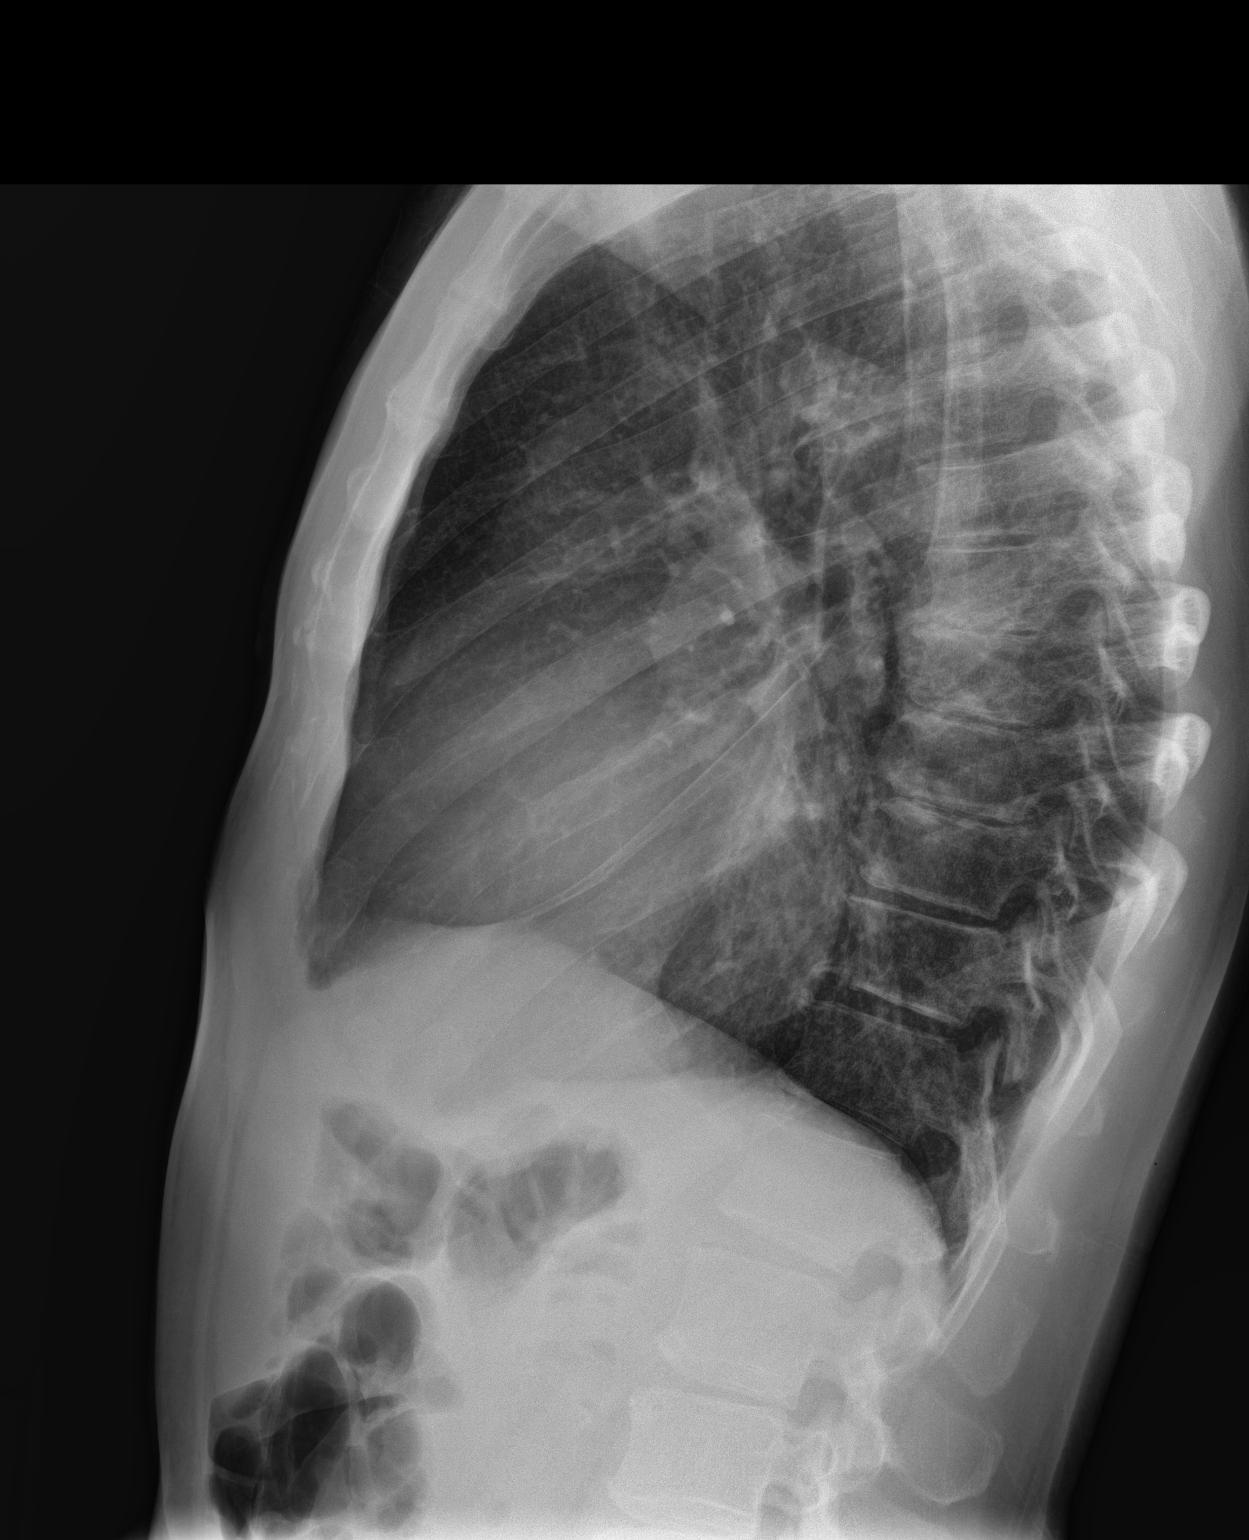

[2 of 2 positions shown; findings below may reference images not displayed]

FINDINGS: The heart size is mildly enlarged and this represents a change from
5989. There is no evidence for airspace disease or pulmonary edema.
The trachea is midline. No evidence for pleural effusions.
Degenerative disc and endplate changes in the mid thoracic spine.
IMPRESSION: Concern for mild cardiomegaly.

No acute lung findings.

## 2020-03-07 DEATH — deceased
